# Patient Record
Sex: Male | Born: 2005 | Hispanic: Yes | Marital: Single | Smoking: Never smoker
Health system: Southern US, Community
[De-identification: ages and names within clinical notes are randomized; demographics above are authoritative.]

---
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
## 2023-01-06 ENCOUNTER — Telehealth (INDEPENDENT_AMBULATORY_CARE_PROVIDER_SITE_OTHER): Payer: Self-pay | Admitting: Primary Care

## 2023-01-06 NOTE — Telephone Encounter (Signed)
Called and pt wanted to reschedule apt for 6/13 @ 3:10p

## 2023-01-07 ENCOUNTER — Ambulatory Visit (INDEPENDENT_AMBULATORY_CARE_PROVIDER_SITE_OTHER): Payer: Self-pay | Admitting: Primary Care

## 2023-01-15 ENCOUNTER — Telehealth (INDEPENDENT_AMBULATORY_CARE_PROVIDER_SITE_OTHER): Payer: Self-pay | Admitting: Primary Care

## 2023-01-15 NOTE — Telephone Encounter (Signed)
Spoke with pt. Will be at apt on 6/13

## 2023-01-16 ENCOUNTER — Ambulatory Visit (INDEPENDENT_AMBULATORY_CARE_PROVIDER_SITE_OTHER): Payer: Medicaid Other | Admitting: Primary Care

## 2023-01-16 ENCOUNTER — Encounter (INDEPENDENT_AMBULATORY_CARE_PROVIDER_SITE_OTHER): Payer: Self-pay | Admitting: Primary Care

## 2023-01-16 VITALS — BP 121/71 | HR 72 | Resp 16 | Ht 68.0 in | Wt 229.8 lb

## 2023-01-16 DIAGNOSIS — Z68.41 Body mass index (BMI) pediatric, greater than or equal to 95th percentile for age: Secondary | ICD-10-CM | POA: Diagnosis not present

## 2023-01-16 DIAGNOSIS — Z7689 Persons encountering health services in other specified circumstances: Secondary | ICD-10-CM | POA: Diagnosis not present

## 2023-01-16 DIAGNOSIS — R7303 Prediabetes: Secondary | ICD-10-CM | POA: Diagnosis not present

## 2023-01-16 DIAGNOSIS — E6609 Other obesity due to excess calories: Secondary | ICD-10-CM

## 2023-01-16 DIAGNOSIS — Z09 Encounter for follow-up examination after completed treatment for conditions other than malignant neoplasm: Secondary | ICD-10-CM | POA: Diagnosis not present

## 2023-01-16 NOTE — Patient Instructions (Addendum)
Prediabetes Prediabetes is when your blood sugar (blood glucose) level is higher than normal but not high enough for you to be diagnosed with type 2 diabetes. Having prediabetes puts you at risk for developing type 2 diabetes (type 2 diabetes mellitus). With certain lifestyle changes, you may be able to prevent or delay the onset of type 2 diabetes. This is important because type 2 diabetes can lead to serious complications, such as: Heart disease. Stroke. Blindness. Kidney disease. Depression. Poor circulation in the feet and legs. In severe cases, this could lead to surgical removal of a leg (amputation). What are the causes? The exact cause of prediabetes is not known. It may result from insulin resistance. Insulin resistance develops when cells in the body do not respond properly to insulin that the body makes. This can cause excess glucose to build up in the blood. High blood glucose (hyperglycemia) can develop. What increases the risk? The following factors may make you more likely to develop this condition: You have a family member with type 2 diabetes. You are older than 45 years. You had a temporary form of diabetes during a pregnancy (gestational diabetes). You had polycystic ovary syndrome (PCOS). You are overweight or obese. You are inactive (sedentary). You have a history of heart disease, including problems with cholesterol levels, high levels of blood fats, or high blood pressure. What are the signs or symptoms? You may have no symptoms. If you do have symptoms, they may include: Increased hunger. Increased thirst. Increased urination. Vision changes, such as blurry vision. Tiredness (fatigue). How is this diagnosed? This condition can be diagnosed with blood tests. Your blood glucose may be checked with one or more of the following tests: A fasting blood glucose (FBG) test. You will not be allowed to eat (you will fast) for at least 8 hours before a blood sample is  taken. An A1C blood test (hemoglobin A1C). This test provides information about blood glucose levels over the previous 2?3 months. An oral glucose tolerance test (OGTT). This test measures your blood glucose at two points in time: After fasting. This is your baseline level. Two hours after you drink a beverage that contains glucose. You may be diagnosed with prediabetes if: Your FBG is 100?125 mg/dL (4.0-9.8 mmol/L). Your A1C level is 5.7?6.4% (39-46 mmol/mol). Your OGTT result is 140?199 mg/dL (1.1-91 mmol/L). These blood tests may be repeated to confirm your diagnosis. How is this treated? Treatment may include dietary and lifestyle changes to help lower your blood glucose and prevent type 2 diabetes from developing. In some cases, medicine may be prescribed to help lower the risk of type 2 diabetes. Follow these instructions at home: Nutrition  Follow a healthy meal plan. This includes eating lean proteins, whole grains, legumes, fresh fruits and vegetables, low-fat dairy products, and healthy fats. Follow instructions from your health care provider about eating or drinking restrictions. Meet with a dietitian to create a healthy eating plan that is right for you. Lifestyle Do moderate-intensity exercise for at least 30 minutes a day on 5 or more days each week, or as told by your health care provider. A mix of activities may be best, such as: Brisk walking, swimming, biking, and weight lifting. Lose weight as told by your health care provider. Losing 5-7% of your body weight can reverse insulin resistance. Do not drink alcohol if: Your health care provider tells you not to drink. You are pregnant, may be pregnant, or are planning to become pregnant. If you drink alcohol:  Limit how much you use to: 0-1 drink a day for women. 0-2 drinks a day for men. Be aware of how much alcohol is in your drink. In the [place] one drink equals one 12 oz bottle of beer (355 mL), one 5 oz glass of wine  (148 mL), or one 1 oz glass of hard liquor (44 mL). General instructions Take over-the-counter and prescription medicines only as told by your health care provider. You may be prescribed medicines that help lower the risk of type 2 diabetes. Do not use any products that contain nicotine or tobacco, such as cigarettes, e-cigarettes, and chewing tobacco. If you need help quitting, ask your health care provider. Keep all follow-up visits. This is important. Where to find more information American Diabetes Association: www.diabetes.org Academy of Nutrition and Dietetics: www.eatright.org American Heart Association: www.heart.org Contact a health care provider if: You have any of these symptoms: Increased hunger. Increased urination. Increased thirst. Fatigue. Vision changes, such as blurry vision. Get help right away if you: Have shortness of breath. Feel confused. Vomit or feel like you may vomit. Summary Prediabetes is when your blood sugar (blood glucose)level is higher than normal but not high enough for you to be diagnosed with type 2 diabetes. Having prediabetes puts you at risk for developing type 2 diabetes (type 2 diabetes mellitus). Make lifestyle changes such as eating a healthy diet and exercising regularly to help prevent diabetes. Lose weight as told by your health care provider. This information is not intended to replace advice given to you by your health care provider. Make sure you discuss any questions you have with your health care provider. Document Revised: 10/21/2019 Document Reviewed: 10/21/2019 Elsevier Patient Education  2024 [employer] Calorie Counting for Dakota Koch Calories are units of energy. Your body needs a certain number of calories from food to keep going throughout the day. When you eat or drink more calories than your body needs, your body stores the extra calories mostly as fat. When you eat or drink fewer calories than your body needs, your body  burns fat to get the energy it needs. Calorie counting means keeping track of how many calories you eat and drink each day. Calorie counting can be helpful if you need to lose weight. If you eat fewer calories than your body needs, you should lose weight. Ask your health care provider what a healthy weight is for you. For calorie counting to work, you will need to eat the right number of calories each day to lose a healthy amount of weight per week. A dietitian can help you figure out how many calories you need in a day and will suggest ways to reach your calorie goal. A healthy amount of weight to lose each week is usually 1-2 lb (0.5-0.9 kg). This usually means that your daily calorie intake should be reduced by 500-750 calories. Eating 1,200-1,500 calories a day can help most women lose weight. Eating 1,500-1,800 calories a day can help most men lose weight. What do I need to know about calorie counting? Work with your health care provider or dietitian to determine how many calories you should get each day. To meet your daily calorie goal, you will need to: Find out how many calories are in each food that you would like to eat. Try to do this before you eat. Decide how much of the food you plan to eat. Keep a food log. Do this by writing down what you ate and how many calories  it had. To successfully lose weight, it is important to balance calorie counting with a healthy lifestyle that includes regular activity. Where do I find calorie information?  The number of calories in a food can be found on a Nutrition Facts label. If a food does not have a Nutrition Facts label, try to look up the calories online or ask your dietitian for help. Remember that calories are listed per serving. If you choose to have more than one serving of a food, you will have to multiply the calories per serving by the number of servings you plan to eat. For example, the label on a package of bread might say that a serving  size is 1 slice and that there are 90 calories in a serving. If you eat 1 slice, you will have eaten 90 calories. If you eat 2 slices, you will have eaten 180 calories. How do I keep a food log? After each time that you eat, record the following in your food log as soon as possible: What you ate. Be sure to include toppings, sauces, and other extras on the food. How much you ate. This can be measured in cups, ounces, or number of items. How many calories were in each food and drink. The total number of calories in the food you ate. Keep your food log near you, such as in a pocket-sized notebook or on an app or website on your mobile phone. Some programs will calculate calories for you and show you how many calories you have left to meet your daily goal. What are some portion-control tips? Know how many calories are in a serving. This will help you know how many servings you can have of a certain food. Use a measuring cup to measure serving sizes. You could also try weighing out portions on a kitchen scale. With time, you will be able to estimate serving sizes for some foods. Take time to put servings of different foods on your favorite plates or in your favorite bowls and cups so you know what a serving looks like. Try not to eat straight from a food's packaging, such as from a bag or box. Eating straight from the package makes it hard to see how much you are eating and can lead to overeating. Put the amount you would like to eat in a cup or on a plate to make sure you are eating the right portion. Use smaller plates, glasses, and bowls for smaller portions and to prevent overeating. Try not to multitask. For example, avoid watching TV or using your computer while eating. If it is time to eat, sit down at a table and enjoy your food. This will help you recognize when you are full. It will also help you be more mindful of what and how much you are eating. What are tips for following this plan? Reading  food labels Check the calorie count compared with the serving size. The serving size may be smaller than what you are used to eating. Check the source of the calories. Try to choose foods that are high in protein, fiber, and vitamins, and low in saturated fat, trans fat, and sodium. Shopping Read nutrition labels while you shop. This will help you make healthy decisions about which foods to buy. Pay attention to nutrition labels for low-fat or fat-free foods. These foods sometimes have the same number of calories or more calories than the full-fat versions. They also often have added sugar, starch, or salt to make  up for flavor that was removed with the fat. Make a grocery list of lower-calorie foods and stick to it. Cooking Try to cook your favorite foods in a healthier way. For example, try baking instead of frying. Use low-fat dairy products. Meal planning Use more fruits and vegetables. One-half of your plate should be fruits and vegetables. Include lean proteins, such as chicken, [place], and fish. Lifestyle Each week, aim to do one of the following: 150 minutes of moderate exercise, such as walking. 75 minutes of vigorous exercise, such as running. General information Know how many calories are in the foods you eat most often. This will help you calculate calorie counts faster. Find a way of tracking calories that works for you. Get creative. Try different apps or programs if writing down calories does not work for you. What foods should I eat?  Eat nutritious foods. It is better to have a nutritious, high-calorie food, such as an avocado, than a food with few nutrients, such as a bag of potato chips. Use your calories on foods and drinks that will fill you up and will not leave you hungry soon after eating. Examples of foods that fill you up are nuts and nut butters, vegetables, lean proteins, and high-fiber foods such as whole grains. High-fiber foods are foods with more than 5 g of  fiber per serving. Pay attention to calories in drinks. Low-calorie drinks include water and unsweetened drinks. The items listed above may not be a complete list of foods and beverages you can eat. Contact a dietitian for more information. What foods should I limit? Limit foods or drinks that are not good sources of vitamins, minerals, or protein or that are high in unhealthy fats. These include: Candy. Other sweets. Sodas, specialty coffee drinks, alcohol, and juice. The items listed above may not be a complete list of foods and beverages you should avoid. Contact a dietitian for more information. How do I count calories when eating out? Pay attention to portions. Often, portions are much larger when eating out. Try these tips to keep portions smaller: Consider sharing a meal instead of getting your own. If you get your own meal, eat only half of it. Before you start eating, ask for a container and put half of your meal into it. When available, consider ordering smaller portions from the menu instead of full portions. Pay attention to your food and drink choices. Knowing the way food is cooked and what is included with the meal can help you eat fewer calories. If calories are listed on the menu, choose the lower-calorie options. Choose dishes that include vegetables, fruits, whole grains, low-fat dairy products, and lean proteins. Choose items that are boiled, broiled, grilled, or steamed. Avoid items that are buttered, battered, fried, or served with cream sauce. Items labeled as crispy are usually fried, unless stated otherwise. Choose water, low-fat milk, unsweetened iced tea, or other drinks without added sugar. If you want an alcoholic beverage, choose a lower-calorie option, such as a glass of wine or light beer. Ask for dressings, sauces, and syrups on the side. These are usually high in calories, so you should limit the amount you eat. If you want a salad, choose a garden salad and ask  for grilled meats. Avoid extra toppings such as bacon, cheese, or fried items. Ask for the dressing on the side, or ask for olive oil and vinegar or lemon to use as dressing. Estimate how many servings of a food you are given. Knowing  serving sizes will help you be aware of how much food you are eating at restaurants. Where to find more information Centers for Disease Control and Prevention: FootballExhibition.com.br [place]. Department of Agriculture: WrestlingReporter.dk Summary Calorie counting means keeping track of how many calories you eat and drink each day. If you eat fewer calories than your body needs, you should lose weight. A healthy amount of weight to lose per week is usually 1-2 lb (0.5-0.9 kg). This usually means reducing your daily calorie intake by 500-750 calories. The number of calories in a food can be found on a Nutrition Facts label. If a food does not have a Nutrition Facts label, try to look up the calories online or ask your dietitian for help. Use smaller plates, glasses, and bowls for smaller portions and to prevent overeating. Use your calories on foods and drinks that will fill you up and not leave you hungry shortly after a meal. This information is not intended to replace advice given to you by your health care provider. Make sure you discuss any questions you have with your health care provider. Document Revised: 09/02/2019 Document Reviewed: 09/02/2019 Elsevier Patient Education  2023 [employer].

## 2023-01-16 NOTE — Progress Notes (Signed)
  [facility]   Subjective:  [name] is a 17 y.o. male presents for hospital follow up and establish care. Patient presented to ED from home due to intractable nausea, vomiting, and abdominal pain. Admit date to the hospital was 12/24/22, patient was discharged from the hospital on 12/26/22, patient was admitted for: SIRS (systemic inflammatory response syndrome)  and AKI (acute kidney injury) . Today he is feeling much better thinking back on this event he had not taken in a lot of fluids or foods. He also learns he is prediabetic. Reveled after reviewing labs. Patient has No headache, No chest pain, No abdominal pain - No Nausea, No new weakness tingling or numbness, No Cough - shortness of breath  Past Medical History:  Diagnosis Date   Contact dermatitis 12/23/2013     No Known Allergies    No current outpatient medications on file prior to visit.   No current facility-administered medications on file prior to visit.   Review of System: Comprehensive ROS Pertinent positive and negative noted in HPI    Objective:  Blood Pressure 121/71   Pulse 72   Respiration 16   Height 5\' 8"  (1.727 m)   Weight 229 lb 12.8 oz (104.2 kg)   Oxygen Saturation 98%   Body Mass Index 34.94 kg/m   Filed Weights   01/16/23 1528  Weight: 229 lb 12.8 oz (104.2 kg)    Physical Exam: General Appearance: Well nourished, in no apparent distress. Eyes: PERRLA, EOMs, conjunctiva no swelling or erythema Sinuses: No Frontal/maxillary tenderness ENT/Mouth: Ext aud canals clear, TMs without erythema, Hearing normal.  Neck: Supple, thyroid normal.  Respiratory: Respiratory effort normal, BS equal bilaterally without rales, rhonchi, wheezing or stridor.  Cardio: RRR with no MRGs. Brisk peripheral pulses without edema.  Abdomen: Soft, + BS.  Non tender, no guarding, rebound, hernias, masses. Lymphatics: Non tender without lymphadenopathy.  Musculoskeletal: Full ROM, 5/5  strength, normal gait.  Skin: Warm, dry without rashes, lesions, ecchymosis.  Neuro: Cranial nerves intact. Normal muscle tone, no cerebellar symptoms. Sensation intact.  Psych: Awake and oriented X 3, normal affect, Insight and Judgment appropriate.    Assessment:  Dakota Koch was seen today for hospitalization follow-up.  Diagnoses and all orders for this visit:  Prediabetes His father is a diabetic  - educated on lifestyle modifications, including but not limited to diet choices and adding exercise to daily routine. Prediabetes is 5.7-6.4 monitor carbohydrates -rice, potatoes, tortillas, breads, pasta, sweets, sodas.  Increase exercising to help maintain appropriate weight.   Hospital discharge follow-up  Obesity due to excess calories without serious comorbidity with body mass index (BMI) in 95th to 98th percentile for age in pediatric patient Discussed neither him or girlfriend cook stop eating out too much process foods , go online and find easy healthy foods to cook and exercise. 6-10 lbs weight loss agreed uon on f/up  Encounter to establish care    This note has been created with Education officer, environmental. Any transcriptional errors are unintentional.   Grayce Sessions, NP 01/16/2023, 4:10 PM

## 2023-02-03 ENCOUNTER — Other Ambulatory Visit: Payer: Self-pay

## 2023-02-03 ENCOUNTER — Inpatient Hospital Stay (HOSPITAL_COMMUNITY)
Admission: EM | Admit: 2023-02-03 | Discharge: 2023-02-05 | DRG: 683 | Disposition: A | Discharge status: Home / Self Care | Payer: Medicaid Other | Attending: Internal Medicine | Admitting: Internal Medicine

## 2023-02-03 DIAGNOSIS — R809 Proteinuria, unspecified: Secondary | ICD-10-CM | POA: Diagnosis present

## 2023-02-03 DIAGNOSIS — M6282 Rhabdomyolysis: Secondary | ICD-10-CM | POA: Diagnosis present

## 2023-02-03 DIAGNOSIS — E860 Dehydration: Secondary | ICD-10-CM | POA: Diagnosis present

## 2023-02-03 DIAGNOSIS — R739 Hyperglycemia, unspecified: Secondary | ICD-10-CM | POA: Diagnosis present

## 2023-02-03 DIAGNOSIS — K2970 Gastritis, unspecified, without bleeding: Secondary | ICD-10-CM | POA: Diagnosis present

## 2023-02-03 DIAGNOSIS — E872 Acidosis, unspecified: Secondary | ICD-10-CM | POA: Diagnosis present

## 2023-02-03 DIAGNOSIS — R7303 Prediabetes: Secondary | ICD-10-CM | POA: Diagnosis present

## 2023-02-03 DIAGNOSIS — N181 Chronic kidney disease, stage 1: Secondary | ICD-10-CM | POA: Diagnosis present

## 2023-02-03 DIAGNOSIS — E669 Obesity, unspecified: Secondary | ICD-10-CM | POA: Diagnosis present

## 2023-02-03 DIAGNOSIS — E871 Hypo-osmolality and hyponatremia: Secondary | ICD-10-CM | POA: Diagnosis present

## 2023-02-03 DIAGNOSIS — N179 Acute kidney failure, unspecified: Secondary | ICD-10-CM | POA: Diagnosis not present

## 2023-02-03 DIAGNOSIS — Z6835 Body mass index (BMI) 35.0-35.9, adult: Secondary | ICD-10-CM

## 2023-02-03 LAB — COMPREHENSIVE METABOLIC PANEL
ALT: 35 U/L (ref 0–44)
AST: 32 U/L (ref 15–41)
Albumin: 6 g/dL — ABNORMAL HIGH (ref 3.5–5.0)
Alkaline Phosphatase: 93 U/L (ref 38–126)
Anion gap: 21 — ABNORMAL HIGH (ref 5–15)
BUN: 38 mg/dL — ABNORMAL HIGH (ref 6–20)
CO2: 17 mmol/L — ABNORMAL LOW (ref 22–32)
Calcium: 10.6 mg/dL — ABNORMAL HIGH (ref 8.9–10.3)
Chloride: 97 mmol/L — ABNORMAL LOW (ref 98–111)
Creatinine, Ser: 3.05 mg/dL — ABNORMAL HIGH (ref 0.61–1.24)
GFR, Estimated: 29 mL/min — ABNORMAL LOW (ref >60)
Glucose, Bld: 168 mg/dL — ABNORMAL HIGH (ref 70–99)
Potassium: 3.9 mmol/L (ref 3.5–5.1)
Sodium: 135 mmol/L (ref 135–145)
Total Bilirubin: 0.7 mg/dL (ref 0.3–1.2)
Total Protein: 9.7 g/dL — ABNORMAL HIGH (ref 6.5–8.1)

## 2023-02-03 LAB — CBC
HCT: 51.8 % (ref 39.0–52.0)
Hemoglobin: 17.5 g/dL — ABNORMAL HIGH (ref 13.0–17.0)
MCH: 28.7 pg (ref 26.0–34.0)
MCHC: 33.8 g/dL (ref 30.0–36.0)
MCV: 84.9 fL (ref 80.0–100.0)
Platelets: 252 10*3/uL (ref 150–400)
RBC: 6.1 MIL/uL — ABNORMAL HIGH (ref 4.22–5.81)
RDW: 12.8 % (ref 11.5–15.5)
WBC: 18.3 10*3/uL — ABNORMAL HIGH (ref 4.0–10.5)
nRBC: 0 % (ref 0.0–0.2)

## 2023-02-03 LAB — URINALYSIS, ROUTINE W REFLEX MICROSCOPIC
Glucose, UA: NEGATIVE mg/dL
Hgb urine dipstick: NEGATIVE
Ketones, ur: 5 mg/dL — AB
Leukocytes,Ua: NEGATIVE
Nitrite: NEGATIVE
Protein, ur: 100 mg/dL — AB
Specific Gravity, Urine: 1.021 (ref 1.005–1.030)
pH: 5 (ref 5.0–8.0)

## 2023-02-03 LAB — LIPASE, BLOOD: Lipase: 30 U/L (ref 11–51)

## 2023-02-03 NOTE — ED Triage Notes (Signed)
Pt arrives to ED c/o nausea/ vomiting x 1 day. Pt with multiple episodes fo each. Pt reports that he was seen 1 month ago for AKI with same symptoms. Pt unable to contain anything PO. Pt endorses cramping/right side pain when vomiting

## 2023-02-04 ENCOUNTER — Emergency Department (HOSPITAL_COMMUNITY): Payer: Medicaid Other

## 2023-02-04 ENCOUNTER — Encounter (HOSPITAL_COMMUNITY): Payer: Self-pay | Admitting: Internal Medicine

## 2023-02-04 DIAGNOSIS — K2970 Gastritis, unspecified, without bleeding: Secondary | ICD-10-CM | POA: Diagnosis not present

## 2023-02-04 DIAGNOSIS — Z6835 Body mass index (BMI) 35.0-35.9, adult: Secondary | ICD-10-CM | POA: Diagnosis not present

## 2023-02-04 DIAGNOSIS — R109 Unspecified abdominal pain: Secondary | ICD-10-CM | POA: Diagnosis not present

## 2023-02-04 DIAGNOSIS — E669 Obesity, unspecified: Secondary | ICD-10-CM | POA: Diagnosis not present

## 2023-02-04 DIAGNOSIS — N179 Acute kidney failure, unspecified: Secondary | ICD-10-CM

## 2023-02-04 DIAGNOSIS — E871 Hypo-osmolality and hyponatremia: Secondary | ICD-10-CM | POA: Diagnosis not present

## 2023-02-04 DIAGNOSIS — R112 Nausea with vomiting, unspecified: Secondary | ICD-10-CM | POA: Diagnosis not present

## 2023-02-04 DIAGNOSIS — E872 Acidosis, unspecified: Secondary | ICD-10-CM | POA: Diagnosis not present

## 2023-02-04 DIAGNOSIS — R7303 Prediabetes: Secondary | ICD-10-CM | POA: Diagnosis not present

## 2023-02-04 DIAGNOSIS — R739 Hyperglycemia, unspecified: Secondary | ICD-10-CM | POA: Diagnosis not present

## 2023-02-04 DIAGNOSIS — N181 Chronic kidney disease, stage 1: Secondary | ICD-10-CM | POA: Diagnosis not present

## 2023-02-04 DIAGNOSIS — R809 Proteinuria, unspecified: Secondary | ICD-10-CM | POA: Diagnosis not present

## 2023-02-04 DIAGNOSIS — E869 Volume depletion, unspecified: Secondary | ICD-10-CM | POA: Diagnosis not present

## 2023-02-04 DIAGNOSIS — M6282 Rhabdomyolysis: Secondary | ICD-10-CM | POA: Diagnosis not present

## 2023-02-04 DIAGNOSIS — E860 Dehydration: Secondary | ICD-10-CM | POA: Diagnosis not present

## 2023-02-04 DIAGNOSIS — N3289 Other specified disorders of bladder: Secondary | ICD-10-CM | POA: Diagnosis not present

## 2023-02-04 LAB — URINALYSIS, ROUTINE W REFLEX MICROSCOPIC
Bilirubin Urine: NEGATIVE
Glucose, UA: NEGATIVE mg/dL
Hgb urine dipstick: NEGATIVE
Ketones, ur: 20 mg/dL — AB
Leukocytes,Ua: NEGATIVE
Nitrite: NEGATIVE
Protein, ur: 30 mg/dL — AB
Specific Gravity, Urine: 1.025 (ref 1.005–1.030)
pH: 5 (ref 5.0–8.0)

## 2023-02-04 LAB — RENAL FUNCTION PANEL
Albumin: 6 g/dL — ABNORMAL HIGH (ref 3.5–5.0)
Anion gap: 20 — ABNORMAL HIGH (ref 5–15)
BUN: 37 mg/dL — ABNORMAL HIGH (ref 6–20)
CO2: 17 mmol/L — ABNORMAL LOW (ref 22–32)
Calcium: 10.3 mg/dL (ref 8.9–10.3)
Chloride: 95 mmol/L — ABNORMAL LOW (ref 98–111)
Creatinine, Ser: 3.04 mg/dL — ABNORMAL HIGH (ref 0.61–1.24)
GFR, Estimated: 29 mL/min — ABNORMAL LOW (ref >60)
Glucose, Bld: 159 mg/dL — ABNORMAL HIGH (ref 70–99)
Phosphorus: 5.2 mg/dL — ABNORMAL HIGH (ref 2.5–4.6)
Potassium: 4 mmol/L (ref 3.5–5.1)
Sodium: 132 mmol/L — ABNORMAL LOW (ref 135–145)

## 2023-02-04 LAB — CBC
HCT: 46.5 % (ref 39.0–52.0)
Hemoglobin: 15.9 g/dL (ref 13.0–17.0)
MCH: 28.9 pg (ref 26.0–34.0)
MCHC: 34.2 g/dL (ref 30.0–36.0)
MCV: 84.5 fL (ref 80.0–100.0)
Platelets: 220 10*3/uL (ref 150–400)
RBC: 5.5 MIL/uL (ref 4.22–5.81)
RDW: 12.9 % (ref 11.5–15.5)
WBC: 13.7 10*3/uL — ABNORMAL HIGH (ref 4.0–10.5)
nRBC: 0 % (ref 0.0–0.2)

## 2023-02-04 LAB — COMPREHENSIVE METABOLIC PANEL
ALT: 35 U/L (ref 0–44)
CO2: 14 mmol/L — ABNORMAL LOW (ref 22–32)
Calcium: 10.5 mg/dL — ABNORMAL HIGH (ref 8.9–10.3)
Creatinine, Ser: 3.09 mg/dL — ABNORMAL HIGH (ref 0.61–1.24)
GFR, Estimated: 29 mL/min — ABNORMAL LOW (ref >60)
Glucose, Bld: 162 mg/dL — ABNORMAL HIGH (ref 70–99)

## 2023-02-04 LAB — GLUCOSE, CAPILLARY
Glucose-Capillary: 116 mg/dL — ABNORMAL HIGH (ref 70–99)
Glucose-Capillary: 140 mg/dL — ABNORMAL HIGH (ref 70–99)
Glucose-Capillary: 134 mg/dL — ABNORMAL HIGH (ref 70–99)

## 2023-02-04 LAB — SODIUM, URINE, RANDOM: Sodium, Ur: 20 mmol/L

## 2023-02-04 LAB — CREATININE, SERUM
Creatinine, Ser: 2.19 mg/dL — ABNORMAL HIGH (ref 0.61–1.24)
GFR, Estimated: 44 mL/min — ABNORMAL LOW (ref >60)

## 2023-02-04 LAB — CK: Total CK: 411 U/L — ABNORMAL HIGH (ref 49–397)

## 2023-02-04 LAB — CREATININE, URINE, RANDOM: Creatinine, Urine: 263 mg/dL

## 2023-02-04 MED ORDER — PROCHLORPERAZINE EDISYLATE 10 MG/2ML IJ SOLN: 5.0000 mg | Freq: Four times a day (QID) | INTRAMUSCULAR | Status: DC | PRN

## 2023-02-04 MED ORDER — INSULIN ASPART 100 UNIT/ML IJ SOLN
0.0000 [IU] | Freq: Three times a day (TID) | INTRAMUSCULAR | Status: DC
  Administered 2023-02-04: 1 [IU] via SUBCUTANEOUS

## 2023-02-04 MED ORDER — TRAZODONE HCL 50 MG PO TABS: 50.0000 mg | ORAL_TABLET | Freq: Every evening | ORAL | Status: DC | PRN

## 2023-02-04 MED ORDER — ENOXAPARIN SODIUM 40 MG/0.4ML IJ SOSY: 40.0000 mg | PREFILLED_SYRINGE | INTRAMUSCULAR | Status: DC

## 2023-02-04 MED ORDER — SENNOSIDES-DOCUSATE SODIUM 8.6-50 MG PO TABS: 1.0000 | ORAL_TABLET | Freq: Every evening | ORAL | Status: DC | PRN

## 2023-02-04 MED ORDER — SODIUM CHLORIDE 0.9 % IV BOLUS
1000.0000 mL | Freq: Once | INTRAVENOUS | Status: AC
  Administered 2023-02-04: 1000 mL via INTRAVENOUS

## 2023-02-04 MED ORDER — HYDRALAZINE HCL 20 MG/ML IJ SOLN: 10.0000 mg | INTRAMUSCULAR | Status: DC | PRN

## 2023-02-04 MED ORDER — MELATONIN 5 MG PO TABS: 5.0000 mg | ORAL_TABLET | Freq: Every evening | ORAL | Status: DC | PRN

## 2023-02-04 MED ORDER — SODIUM CHLORIDE 0.9 % IV SOLN: INTRAVENOUS | Status: AC

## 2023-02-04 MED ORDER — METOPROLOL TARTRATE 5 MG/5ML IV SOLN: 5.0000 mg | INTRAVENOUS | Status: DC | PRN

## 2023-02-04 MED ORDER — IPRATROPIUM-ALBUTEROL 0.5-2.5 (3) MG/3ML IN SOLN: 3.0000 mL | RESPIRATORY_TRACT | Status: DC | PRN

## 2023-02-04 MED ORDER — MORPHINE SULFATE (PF) 4 MG/ML IV SOLN
4.0000 mg | Freq: Once | INTRAVENOUS | Status: AC
  Administered 2023-02-04: 4 mg via INTRAVENOUS
  Filled 2023-02-04: qty 1

## 2023-02-04 MED ORDER — ACETAMINOPHEN 325 MG PO TABS: 650.0000 mg | ORAL_TABLET | Freq: Four times a day (QID) | ORAL | Status: DC | PRN

## 2023-02-04 MED ORDER — ONDANSETRON HCL 4 MG/2ML IJ SOLN
4.0000 mg | Freq: Once | INTRAMUSCULAR | Status: AC
  Administered 2023-02-04: 4 mg via INTRAVENOUS
  Filled 2023-02-04: qty 2

## 2023-02-04 MED ORDER — SODIUM BICARBONATE 650 MG PO TABS
650.0000 mg | ORAL_TABLET | Freq: Two times a day (BID) | ORAL | Status: DC
  Administered 2023-02-04 (×2): 650 mg via ORAL
  Filled 2023-02-04 (×2): qty 1

## 2023-02-04 MED ORDER — SODIUM CHLORIDE 0.9 % IV SOLN: INTRAVENOUS | Status: DC

## 2023-02-04 MED ORDER — POLYETHYLENE GLYCOL 3350 17 G PO PACK: 17.0000 g | PACK | Freq: Every day | ORAL | Status: DC | PRN

## 2023-02-04 NOTE — ED Notes (Signed)
ED TO INPATIENT HANDOFF REPORT  ED Nurse Name and Phone #: [phone] Dakota Koch  S Name/Age/Gender Dakota Koch 17 y.o. male Room/Bed: 026C/026C  Code Status   Code Status: Prior  Home/SNF/Other Home Patient oriented to: self, place, time, and situation Is this baseline? Yes   Triage Complete: Triage complete  Chief Complaint AKI (acute kidney injury) (HCC) [N17.9]  Triage Note Pt arrives to ED c/o nausea/ vomiting x 1 day. Pt with multiple episodes fo each. Pt reports that he was seen 1 month ago for AKI with same symptoms. Pt unable to contain anything PO. Pt endorses cramping/right side pain when vomiting   Allergies No Known Allergies  Level of Care/Admitting Diagnosis ED Disposition     ED Disposition  Admit   Condition  --   Comment  Hospital Area: [facility] [[id number]]  Level of Care: Med-Surg [16]  May admit patient to [facility] or [facility] if equivalent level of care is available:: Yes  Covid Evaluation: Asymptomatic - no recent exposure (last 10 days) testing not required  Diagnosis: AKI (acute kidney injury) [facility]) [[id number]]  Admitting Physician: Darlin Drop [[id number]]  Attending Physician: Darlin Drop [[id number]]  Certification:: I certify this patient will need inpatient services for at least 2 midnights  Estimated Length of Stay: 2          B Medical/Surgery History Past Medical History:  Diagnosis Date   Contact dermatitis 12/23/2013   No past surgical history on file.   A IV Location/Drains/Wounds Patient Lines/Drains/Airways Status     Active Line/Drains/Airways     Name Placement date Placement time Site Days   Peripheral IV 02/04/23 20 G Anterior;Right Forearm 02/04/23  0045  Forearm  less than 1            Intake/Output Last 24 hours  Intake/Output Summary (Last 24 hours) at 02/04/2023 0143 Last data filed at 02/04/2023 0143 Gross per 24 hour  Intake 1000 ml  Output --  Net 1000 ml     Labs/Imaging Results for orders placed or performed during the hospital encounter of 02/03/23 (from the past 48 hour(s))  Lipase, blood     Status: None   Collection Time: 02/03/23 10:13 PM  Result Value Ref Range   Lipase 30 11 - 51 U/L    Comment: Performed at [facility], [address] [place], [place] [address]  Comprehensive metabolic panel     Status: Abnormal   Collection Time: 02/03/23 10:13 PM  Result Value Ref Range   Sodium 135 135 - 145 mmol/L   Potassium 3.9 3.5 - 5.1 mmol/L   Chloride 97 (L) 98 - 111 mmol/L   CO2 17 (L) 22 - 32 mmol/L   Glucose, Bld 168 (H) 70 - 99 mg/dL    Comment: Glucose reference range applies only to samples taken after fasting for at least 8 hours.   BUN 38 (H) 6 - 20 mg/dL   Creatinine, Ser 2.84 (H) 0.61 - 1.24 mg/dL   Calcium 13.2 (H) 8.9 - 10.3 mg/dL   Total Protein 9.7 (H) 6.5 - 8.1 g/dL   Albumin 6.0 (H) 3.5 - 5.0 g/dL   AST 32 15 - 41 U/L   ALT 35 0 - 44 U/L   Alkaline Phosphatase 93 38 - 126 U/L   Total Bilirubin 0.7 0.3 - 1.2 mg/dL   GFR, Estimated 29 (L) >60 mL/min    Comment: (NOTE) Calculated using the CKD-EPI Creatinine Equation (2021)  Anion gap 21 (H) 5 - 15    Comment: ELECTROLYTES REPEATED TO VERIFY Performed at [facility], [address], [place], [place] [address]   CBC     Status: Abnormal   Collection Time: 02/03/23 10:13 PM  Result Value Ref Range   WBC 18.3 (H) 4.0 - 10.5 K/uL   RBC 6.10 (H) 4.22 - 5.81 MIL/uL   Hemoglobin 17.5 (H) 13.0 - 17.0 g/dL   HCT 91.4 78.2 - 95.6 %   MCV 84.9 80.0 - 100.0 fL   MCH 28.7 26.0 - 34.0 pg   MCHC 33.8 30.0 - 36.0 g/dL   RDW 21.3 08.6 - 57.8 %   Platelets 252 150 - 400 K/uL   nRBC 0.0 0.0 - 0.2 %    Comment: Performed at [facility], [address], [place], [place] [address]  Urinalysis, Routine w reflex microscopic -Urine, Clean Catch     Status: Abnormal   Collection Time: 02/03/23 10:17 PM  Result Value Ref Range   Color, Urine AMBER (A)  YELLOW    Comment: BIOCHEMICALS MAY BE AFFECTED BY COLOR   APPearance CLOUDY (A) CLEAR   Specific Gravity, Urine 1.021 1.005 - 1.030   pH 5.0 5.0 - 8.0   Glucose, UA NEGATIVE NEGATIVE mg/dL   Hgb urine dipstick NEGATIVE NEGATIVE   Bilirubin Urine MODERATE (A) NEGATIVE   Ketones, ur 5 (A) NEGATIVE mg/dL   Protein, ur 952 (A) NEGATIVE mg/dL   Nitrite NEGATIVE NEGATIVE   Leukocytes,Ua NEGATIVE NEGATIVE   RBC / HPF 0-5 0 - 5 RBC/hpf   WBC, UA 0-5 0 - 5 WBC/hpf   Bacteria, UA RARE (A) NONE SEEN   Squamous Epithelial / HPF 0-5 0 - 5 /HPF   Mucus PRESENT    Hyaline Casts, UA PRESENT    Ca Oxalate Crys, UA PRESENT     Comment: Performed at [facility], [address] [place], [place] [address]   CT Renal Stone Study  Result Date: 02/04/2023 CLINICAL DATA:  Nausea and vomiting for 1 day with flank pain, initial encounter EXAM: CT ABDOMEN AND PELVIS WITHOUT CONTRAST TECHNIQUE: Multidetector CT imaging of the abdomen and pelvis was performed following the standard protocol without IV contrast. RADIATION DOSE REDUCTION: This exam was performed according to the departmental dose-optimization program which includes automated exposure control, adjustment of the mA and/or kV according to patient size and/or use of iterative reconstruction technique. COMPARISON:  12/24/2022 FINDINGS: Lower chest: No acute abnormality. Hepatobiliary: No focal liver abnormality is seen. No gallstones, gallbladder wall thickening, or biliary dilatation. Pancreas: Unremarkable. No pancreatic ductal dilatation or surrounding inflammatory changes. Spleen: Normal in size without focal abnormality. Adrenals/Urinary Tract: Adrenal glands are within normal limits. Kidneys are well visualized bilaterally. No renal calculi are seen. Bladder is partially distended. Stomach/Bowel: Appendix is within normal limits. No obstructive or inflammatory changes of the colon are noted. Small bowel and stomach are within normal limits.  Vascular/Lymphatic: No significant vascular findings are present. No enlarged abdominal or pelvic lymph nodes. Reproductive: Prostate is unremarkable. Other: No abdominal wall hernia or abnormality. No abdominopelvic ascites. Musculoskeletal: No acute or significant osseous findings. IMPRESSION: No acute abnormality noted. Electronically Signed   By: Alcide Clever M.D.   On: 02/04/2023 01:10    Pending Labs Unresulted Labs (From admission, onward)    None       Vitals/Pain Today's Vitals   02/04/23 0105 02/04/23 0115 02/04/23 0130 02/04/23 0136  BP: 130/72 139/83  136/64   Pulse: 76 73 61   Resp: 16     Temp: 97.6 F (36.4 C)     TempSrc: Oral     SpO2: 100% 100% 100%   Weight:      Height:      PainSc:    4     Isolation Precautions No active isolations  Medications Medications  sodium chloride 0.9 % bolus 1,000 mL (0 mLs Intravenous Stopped 02/04/23 0143)  ondansetron (ZOFRAN) injection 4 mg (4 mg Intravenous Given 02/04/23 0104)  morphine (PF) 4 MG/ML injection 4 mg (4 mg Intravenous Given 02/04/23 0105)    Mobility walks     Focused Assessments Renal Assessment Handoff:     R Recommendations: See Admitting Provider Note  Report given to:   Additional Notes: Patient ambulates no assist a/o x 4 vs wnl admit for AKI 20g rfa renal CT normal

## 2023-02-04 NOTE — ED Provider Notes (Addendum)
Dakota Koch Provider Note   CSN: [id number] Arrival date & time: 02/03/23  2126     History  Chief Complaint  Patient presents with   Nausea   Emesis    Dakota Koch is a 17 y.o. male.  Patient presents to the emergency department complaining of 1 day of nausea, vomiting, abdominal cramping.  Patient states he initially had symptoms yesterday afternoon with nausea and 3 episodes of emesis.  He states his symptoms subsided and he had 1 episode of emesis this morning before work.  He states he was okay while at work but upon returning home had 3 more episodes of emesis.  He endorses abdominal cramping that coincides with the symptoms.  The patient states he was admitted a month ago for an acute kidney injury and had similar symptoms at that time.  Patient also complains of right side pain/flank pain.  Patient denies fever, chest pain, shortness of breath.  Past medical history otherwise significant for AKI, SIRS.  HPI     Home Medications Prior to Admission medications   Not on File      Allergies    Patient has no known allergies.    Review of Systems   Review of Systems  Physical Exam Updated Vital Signs BP 136/64   Pulse 61   Temp 97.6 F (36.4 C) (Oral)   Resp 16   Ht 5\' 8"  (1.727 m)   Wt 104.8 kg   SpO2 100%   BMI 35.12 kg/m  Physical Exam Vitals and nursing note reviewed.  Constitutional:      General: He is not in acute distress.    Appearance: He is well-developed.  HENT:     Head: Normocephalic and atraumatic.  Eyes:     General: No scleral icterus.    Conjunctiva/sclera: Conjunctivae normal.  Cardiovascular:     Rate and Rhythm: Normal rate and regular rhythm.     Heart sounds: No murmur heard. Pulmonary:     Effort: Pulmonary effort is normal. No respiratory distress.     Breath sounds: Normal breath sounds.  Abdominal:     Palpations: Abdomen is soft.     Tenderness: There is no abdominal  tenderness. There is right CVA tenderness (Mild).  Musculoskeletal:        General: No swelling.     Cervical back: Neck supple.  Skin:    General: Skin is warm and dry.     Capillary Refill: Capillary refill takes less than 2 seconds.  Neurological:     Mental Status: He is alert.  Psychiatric:        Mood and Affect: Mood normal.     ED Results / Procedures / Treatments   Labs (all labs ordered are listed, but only abnormal results are displayed) Labs Reviewed  COMPREHENSIVE METABOLIC PANEL - Abnormal; Notable for the following components:      Result Value   Chloride 97 (*)    CO2 17 (*)    Glucose, Bld 168 (*)    BUN 38 (*)    Creatinine, Ser 3.05 (*)    Calcium 10.6 (*)    Total Protein 9.7 (*)    Albumin 6.0 (*)    GFR, Estimated 29 (*)    Anion gap 21 (*)    All other components within normal limits  CBC - Abnormal; Notable for the following components:   WBC 18.3 (*)    RBC 6.10 (*)    Hemoglobin  17.5 (*)    All other components within normal limits  URINALYSIS, ROUTINE W REFLEX MICROSCOPIC - Abnormal; Notable for the following components:   Color, Urine AMBER (*)    APPearance CLOUDY (*)    Bilirubin Urine MODERATE (*)    Ketones, ur 5 (*)    Protein, ur 100 (*)    Bacteria, UA RARE (*)    All other components within normal limits  LIPASE, BLOOD    EKG None  Radiology CT Renal Stone Study  Result Date: 02/04/2023 CLINICAL DATA:  Nausea and vomiting for 1 day with flank pain, initial encounter EXAM: CT ABDOMEN AND PELVIS WITHOUT CONTRAST TECHNIQUE: Multidetector CT imaging of the abdomen and pelvis was performed following the standard protocol without IV contrast. RADIATION DOSE REDUCTION: This exam was performed according to the departmental dose-optimization program which includes automated exposure control, adjustment of the mA and/or kV according to patient size and/or use of iterative reconstruction technique. COMPARISON:  12/24/2022 FINDINGS: Lower  chest: No acute abnormality. Hepatobiliary: No focal liver abnormality is seen. No gallstones, gallbladder wall thickening, or biliary dilatation. Pancreas: Unremarkable. No pancreatic ductal dilatation or surrounding inflammatory changes. Spleen: Normal in size without focal abnormality. Adrenals/Urinary Tract: Adrenal glands are within normal limits. Kidneys are well visualized bilaterally. No renal calculi are seen. Bladder is partially distended. Stomach/Bowel: Appendix is within normal limits. No obstructive or inflammatory changes of the colon are noted. Small bowel and stomach are within normal limits. Vascular/Lymphatic: No significant vascular findings are present. No enlarged abdominal or pelvic lymph nodes. Reproductive: Prostate is unremarkable. Other: No abdominal wall hernia or abnormality. No abdominopelvic ascites. Musculoskeletal: No acute or significant osseous findings. IMPRESSION: No acute abnormality noted. Electronically Signed   By: Alcide Clever M.D.   On: 02/04/2023 01:10    Procedures Procedures    Medications Ordered in ED Medications  sodium chloride 0.9 % bolus 1,000 mL (1,000 mLs Intravenous New Bag/Given 02/04/23 0103)  ondansetron (ZOFRAN) injection 4 mg (4 mg Intravenous Given 02/04/23 0104)  morphine (PF) 4 MG/ML injection 4 mg (4 mg Intravenous Given 02/04/23 0105)    ED Course/ Medical Decision Making/ A&P                             Medical Decision Making Amount and/or Complexity of Data Reviewed Radiology: ordered.  Risk Prescription drug management. Decision regarding hospitalization.   This patient presents to the ED for concern of nausea and vomiting, this involves an extensive number of treatment options, and is a complaint that carries with it a high risk of complications and morbidity.  The differential diagnosis includes gastroenteritis, appendicitis, cholecystitis, pyelonephritis, AKI, others   Co morbidities that complicate the patient  evaluation  History of recent AKI   Additional history obtained:  Additional history obtained from visitor bedside External records from outside source obtained and reviewed including discharge summary from May   Lab Tests:  I Ordered, and personally interpreted labs.  The pertinent results include: Creatinine 3.05, BUN 38, WBC 18.3, UA with moderate bilirubin, ketones, protein; normal lipase   Imaging Studies ordered:  I ordered imaging studies including CT renal stone study I independently visualized and interpreted imaging which showed no acute findings I agree with the radiologist interpretation    Consultations Obtained:  I requested consultation with the hospitalist, Dr.Hall, and discussed lab and imaging findings as well as pertinent plan - they recommend: admission   Problem List / ED  Course / Critical interventions / Medication management   I ordered medication including morphine for pain, Zofran for nausea Reevaluation of the patient after these medicines showed that the patient improved I have reviewed the patients home medicines and have made adjustments as needed   Social Determinants of Health:  Patient has Medicaid for his primary insurance type   Test / Admission - Considered:  Patient with significant AKI on laboratory workup today.  Unclear as to the etiology of his AKI.  He denies NSAID use, states that he drinks water frequently, denies any chronic medication use (no nephrotoxic's identified).  Patient needs admission for continued IV hydration, serial labs, further evaluation and management as needed.         Final Clinical Impression(s) / ED Diagnoses Final diagnoses:  AKI (acute kidney injury) [facility])    Rx / DC Orders ED Discharge Orders     None         Pamala Duffel 02/04/23 0141    Darrick Grinder, PA-C 02/04/23 0150    Nira Conn, MD 02/04/23 0800

## 2023-02-04 NOTE — H&P (Signed)
History and Physical  Dakota Koch NFA:[id number] DOB: January 10, 2006 DOA: 02/03/2023  Referring physician: Barrie Dunker, PA-EDP  PCP: Pcp, No  Outpatient Specialists: None Patient coming from: Home  Chief Complaint: Nausea and vomiting   HPI: Dakota Koch is a 17 y.o. male obese, with no significant past medical history who presents with complaints of sudden onset nausea and vomiting multiple times.  Initially started yesterday afternoon.  It recurred today after waking up this morning.  Associated with diffuse abdominal pain.  The patient is not on any prescribed medications.  He does not take any medications over-the-counter.  States he usually drinks water all day.  Works in a [employer], quantity up heavy loads regularly at work.  Today, prior to his evaluation in the ED, he had cramps involving his proximal muscles, thighs and biceps.    States he has been intentional about losing weight and has not been eating much lately, however he stays hydrated, up until this morning when he started vomiting again.  No reported subjective fevers or chills.  No family history of chronic kidney disease or family members on dialysis that he is aware of.  He presented to the ED for further evaluation.  In the ED, the patient's nausea improved with IV antiemetics and IV fluid.  His UA was negative for pyuria, showed calcium oxalate crystal, hyaline cast.  Lab studies were notable for creatinine of 3.04 from normal baseline of 0.8.  The patient was started on IV fluid hydration.  CT renal stone study was unremarkable.  CPK was mildly elevated at 411.    EDP requested admission for further management.  Admitted by [facility], hospitalist service, to MedSurg unit as inpatient status.  Nephrology consulted to assist with the management.  ED Course: Tmax 97.6.  BP 153/82, pulse 72, respiration rate 16, saturation 98% on room air.  Review of Systems: Review of systems as noted in the HPI. All other systems  reviewed and are negative.   Past Medical History:  Diagnosis Date   Contact dermatitis 12/23/2013   No past surgical history on file.  Social History:  reports that he has never smoked. He has never used smokeless tobacco. He reports current drug use. Drug: Marijuana. He reports that he does not drink alcohol.   No Known Allergies  Family history: None reported.  Home medications: None.   Physical Exam: BP (!) 153/82 (BP Location: Left Arm)   Pulse 72   Temp 97.6 F (36.4 C) (Oral)   Resp 16   Ht 5\' 8"  (1.727 m)   Wt 104.8 kg   SpO2 98%   BMI 35.12 kg/m   General: 17 y.o. year-old male well developed well nourished in no acute distress.  Alert and oriented x3. Cardiovascular: Regular rate and rhythm with no rubs or gallops.  No thyromegaly or JVD noted.  No lower extremity edema. 2/4 pulses in all 4 extremities. Respiratory: Clear to auscultation with no wheezes or rales. Good inspiratory effort. Abdomen: Soft nontender nondistended with normal bowel sounds x4 quadrants. Muskuloskeletal: No cyanosis, clubbing or edema noted bilaterally Neuro: CN II-XII intact, strength, sensation, reflexes Skin: No ulcerative lesions noted or rashes Psychiatry: Judgement and insight appear normal. Mood is appropriate for condition and setting          Labs on Admission:  Basic Metabolic Panel: Recent Labs  Lab 02/03/23 2213 02/04/23 0212  NA 132*  135  --   K 4.0  3.9  --   CL 95*  97*  --  CO2 17*  17*  --   GLUCOSE 159*  168*  --   BUN 37*  38*  --   CREATININE 3.04*  3.05* 2.19*  CALCIUM 10.3  10.6*  --   PHOS 5.2*  --    Liver Function Tests: Recent Labs  Lab 02/03/23 2213  AST 32  ALT 35  ALKPHOS 93  BILITOT 0.7  PROT 9.7*  ALBUMIN 6.0*  6.0*   Recent Labs  Lab 02/03/23 2213  LIPASE 30   No results for input(s): "AMMONIA" in the last 168 hours. CBC: Recent Labs  Lab 02/03/23 2213 02/04/23 0212  WBC 18.3* 13.7*  HGB 17.5* 15.9  HCT 51.8  46.5  MCV 84.9 84.5  PLT 252 220   Cardiac Enzymes: Recent Labs  Lab 02/04/23 0212  CKTOTAL 411*    BNP (last 3 results) No results for input(s): "BNP" in the last 8760 hours.  ProBNP (last 3 results) No results for input(s): "PROBNP" in the last 8760 hours.  CBG: No results for input(s): "GLUCAP" in the last 168 hours.  Radiological Exams on Admission: CT Renal Stone Study  Result Date: 02/04/2023 CLINICAL DATA:  Nausea and vomiting for 1 day with flank pain, initial encounter EXAM: CT ABDOMEN AND PELVIS WITHOUT CONTRAST TECHNIQUE: Multidetector CT imaging of the abdomen and pelvis was performed following the standard protocol without IV contrast. RADIATION DOSE REDUCTION: This exam was performed according to the departmental dose-optimization program which includes automated exposure control, adjustment of the mA and/or kV according to patient size and/or use of iterative reconstruction technique. COMPARISON:  12/24/2022 FINDINGS: Lower chest: No acute abnormality. Hepatobiliary: No focal liver abnormality is seen. No gallstones, gallbladder wall thickening, or biliary dilatation. Pancreas: Unremarkable. No pancreatic ductal dilatation or surrounding inflammatory changes. Spleen: Normal in size without focal abnormality. Adrenals/Urinary Tract: Adrenal glands are within normal limits. Kidneys are well visualized bilaterally. No renal calculi are seen. Bladder is partially distended. Stomach/Bowel: Appendix is within normal limits. No obstructive or inflammatory changes of the colon are noted. Small bowel and stomach are within normal limits. Vascular/Lymphatic: No significant vascular findings are present. No enlarged abdominal or pelvic lymph nodes. Reproductive: Prostate is unremarkable. Other: No abdominal wall hernia or abnormality. No abdominopelvic ascites. Musculoskeletal: No acute or significant osseous findings. IMPRESSION: No acute abnormality noted. Electronically Signed   By: Alcide Clever M.D.   On: 02/04/2023 01:10    EKG: I independently viewed the EKG done and my findings are as followed: None available at the time of this visit.  Assessment/Plan Present on Admission:  AKI (acute kidney injury) (HCC)  Principal Problem:   AKI (acute kidney injury) (HCC)  AKI, unclear etiology After vomiting x 1 day presented to the ED Creatinine noted to be elevated 3.05 from normal baseline States he has been drinking a lot of water up until this event as he is trying to lose weight. Denies any use of over-the-counter medications or supplements or NSAIDs. UA positive for calcium oxalate crystals and hyaline casts CT renal stone was unremarkable. Repeat renal function and trend creatinine/GFR Avoid nephrotoxic agents, dehydration and hypotension. Continue IV fluid hydration NS at 125 cc/h x 1 day  High anion gap metabolic acidosis Presented with serum bicarb of 17 and anion gap of 21 Blood sugar 168, prediabetic. Continue IV fluid hydration and repeat chemistry panel  Hypercalcemia likely secondary to dehydration after vomiting Presented with serum calcium of 10.6 Repeat chemistry panel after IV fluid hydration  Mild euvolemic hyponatremia  Serum sodium 132 Continue IV fluid hydration NS  Prediabetes with hyperglycemia Last hemoglobin A1c is 5.8 on 12/25/2022 Serum glucose on presentation 168 Continue to monitor, diet controlled.  Obesity BMI 35 Ongoing intentional weight loss, reported by the patient Recommend weight loss outpatient through regular physical activity and healthy dieting.  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   DVT prophylaxis: Subcu Lovenox daily  Code Status: Full code  Family Communication: Girlfriend at bedside  Disposition Plan: Admitted to MedSurg unit  Consults called: Nephrology consulted  Admission status:  Inpatient status.   Status is: Inpatient The patient requires at least 2 midnights for further evaluation and treatment of present condition.   Darlin Drop MD [facility] Pager [phone]  If 7PM-7AM, please contact night-coverage [email or web address] Password [email or web address]  02/04/2023, 6:10 AM

## 2023-02-04 NOTE — ED Notes (Signed)
Patient ambulatory to room with steady gait c/o n/v that began today with several episodes per girlfriend at bedside. Patient reports he had same symptoms a month ago.Patient reports todays symptoms began after not drinking enough water today at work. Patient states pain to RLQ wraps around into right kidney area. Patient a/o x 4 respirations even and non labored vs wnl

## 2023-02-04 NOTE — TOC CM/SW Note (Signed)
Transition of Care [facility]) - Inpatient Brief Assessment   Patient Details  Name: Keante Trojanowski MRN: [id number] Date of Birth: April 28, 2006  Transition of Care [facility]) CM/SW Contact:    Tom-Johnson, Hershal Coria, RN Phone Number: 02/04/2023, 5:43 PM   Clinical Narrative:  Patient presented to the ED with Nausea,Vomiting and Abdominal cramping. States he felt dehydrated. Nephrology following.   Lives at home with both parents. Employed and independent with his cares. Has a PCP but could not remember the name. Uses [facility] on [address]   No TOC needs or recommendations noted at this time. CM will continue to follow as patient progresses with care towards discharge.        Transition of Care Asessment: Insurance and Status: Insurance coverage has been reviewed Patient has primary care physician: Yes (Could not remember name of PCP) Home environment has been reviewed: Yes Prior level of function:: Independent Prior/Current Home Services: No current home services Social Determinants of Health Reivew: SDOH reviewed no interventions necessary Readmission risk has been reviewed: Yes Transition of care needs: no transition of care needs at this time

## 2023-02-04 NOTE — Progress Notes (Signed)
PROGRESS NOTE    Dakota Koch  WUJ:[id number] DOB: 05-25-2006 DOA: 02/03/2023 PCP: Pcp, No   Brief Narrative:  17 year old without any known past medical history comes to the hospital with sudden onset of nausea and vomiting.  Upon admission she was noted to have acute kidney injury with creatinine 3.04, CT renal study unremarkable, mild rhabdomyolysis.  She was admitted to the hospital for aggressive IV fluids.   Assessment & Plan:  Principal Problem:   AKI (acute kidney injury) (HCC)     AKI, suspect prerenal in nature Mild metabolic acidosis Admission creatinine 3.05, baseline 0.8.  CT renal study is unremarkable.  UA unremarkable.  Continue aggressive IV fluids and monitor renal function and urine output. Nephro following.    Hypercalcemia likely secondary to dehydration after vomiting Second due to dehydration   Mild euvolemic hyponatremia Should improve with IV fluids   Prediabetes with hyperglycemia Last hemoglobin A1c is 5.8 on 12/25/2022 Closely monitor   Obesity BMI 35 Ongoing intentional weight loss, reported by the patient Recommend weight loss outpatient through regular physical activity and healthy dieting.   Rhabdomyolysis CK levels 411, IV fluids.  Repeat again tomorrow   DVT prophylaxis: Lovenox Code Status: Full code Family Communication:   Status is: Inpatient Ongoing management for AKI with IV fluids       Diet Orders (From admission, onward)     Start     Ordered   02/04/23 0209  Diet regular Fluid consistency: Thin  Diet effective now       Question:  Fluid consistency:  Answer:  Thin   02/04/23 0208            Subjective: Doing ok, feels better.    Examination:  General exam: Appears calm and comfortable  Respiratory system: Clear to auscultation. Respiratory effort normal. Cardiovascular system: S1 & S2 heard, RRR. No JVD, murmurs, rubs, gallops or clicks. No pedal edema. Gastrointestinal system: Abdomen is  nondistended, soft and nontender. No organomegaly or masses felt. Normal bowel sounds heard. Central nervous system: Alert and oriented. No focal neurological deficits. Extremities: Symmetric 5 x 5 power. Skin: No rashes, lesions or ulcers Psychiatry: Judgement and insight appear normal. Mood & affect appropriate.  Objective: Vitals:   02/04/23 0145 02/04/23 0241 02/04/23 0640 02/04/23 0722  BP: (!) 141/85 (!) 153/82 (!) 141/96 136/76  Pulse: 75 72 67 72  Resp:  16 18 17   Temp:  97.6 F (36.4 C) 97.8 F (36.6 C) 98.3 F (36.8 C)  TempSrc:  Oral Oral Oral  SpO2: 100% 98% 100% 100%  Weight:   97.5 kg   Height:        Intake/Output Summary (Last 24 hours) at 02/04/2023 0836 Last data filed at 02/04/2023 0143 Gross per 24 hour  Intake 1000 ml  Output --  Net 1000 ml   Filed Weights   02/03/23 2155 02/04/23 0640  Weight: 104.8 kg 97.5 kg    Scheduled Meds:  enoxaparin (LOVENOX) injection  40 mg Subcutaneous Q24H   Continuous Infusions:  sodium chloride 125 mL/hr at 02/04/23 0725    Nutritional status     Body mass index is 32.68 kg/m.  Data Reviewed:   CBC: Recent Labs  Lab 02/03/23 2213 02/04/23 0212  WBC 18.3* 13.7*  HGB 17.5* 15.9  HCT 51.8 46.5  MCV 84.9 84.5  PLT 252 220   Basic Metabolic Panel: Recent Labs  Lab 02/03/23 2213 02/04/23 0212  NA 132*  135  --   K 4.0  3.9  --   CL 95*  97*  --   CO2 17*  17*  --   GLUCOSE 159*  168*  --   BUN 37*  38*  --   CREATININE 3.04*  3.05* 2.19*  CALCIUM 10.3  10.6*  --   PHOS 5.2*  --    GFR: Estimated Creatinine Clearance: 61.9 mL/min (A) (by C-G formula based on SCr of 2.19 mg/dL (H)). Liver Function Tests: Recent Labs  Lab 02/03/23 2213  AST 32  ALT 35  ALKPHOS 93  BILITOT 0.7  PROT 9.7*  ALBUMIN 6.0*  6.0*   Recent Labs  Lab 02/03/23 2213  LIPASE 30   No results for input(s): "AMMONIA" in the last 168 hours. Coagulation Profile: No results for input(s): "INR", "PROTIME" in  the last 168 hours. Cardiac Enzymes: Recent Labs  Lab 02/04/23 0212  CKTOTAL 411*   BNP (last 3 results) No results for input(s): "PROBNP" in the last 8760 hours. HbA1C: No results for input(s): "HGBA1C" in the last 72 hours. CBG: No results for input(s): "GLUCAP" in the last 168 hours. Lipid Profile: No results for input(s): "CHOL", "HDL", "LDLCALC", "TRIG", "CHOLHDL", "LDLDIRECT" in the last 72 hours. Thyroid Function Tests: No results for input(s): "TSH", "T4TOTAL", "FREET4", "T3FREE", "THYROIDAB" in the last 72 hours. Anemia Panel: No results for input(s): "VITAMINB12", "FOLATE", "FERRITIN", "TIBC", "IRON", "RETICCTPCT" in the last 72 hours. Sepsis Labs: No results for input(s): "PROCALCITON", "LATICACIDVEN" in the last 168 hours.  No results found for this or any previous visit (from the past 240 hour(s)).       Radiology Studies: CT Renal Stone Study  Result Date: 02/04/2023 CLINICAL DATA:  Nausea and vomiting for 1 day with flank pain, initial encounter EXAM: CT ABDOMEN AND PELVIS WITHOUT CONTRAST TECHNIQUE: Multidetector CT imaging of the abdomen and pelvis was performed following the standard protocol without IV contrast. RADIATION DOSE REDUCTION: This exam was performed according to the departmental dose-optimization program which includes automated exposure control, adjustment of the mA and/or kV according to patient size and/or use of iterative reconstruction technique. COMPARISON:  12/24/2022 FINDINGS: Lower chest: No acute abnormality. Hepatobiliary: No focal liver abnormality is seen. No gallstones, gallbladder wall thickening, or biliary dilatation. Pancreas: Unremarkable. No pancreatic ductal dilatation or surrounding inflammatory changes. Spleen: Normal in size without focal abnormality. Adrenals/Urinary Tract: Adrenal glands are within normal limits. Kidneys are well visualized bilaterally. No renal calculi are seen. Bladder is partially distended. Stomach/Bowel:  Appendix is within normal limits. No obstructive or inflammatory changes of the colon are noted. Small bowel and stomach are within normal limits. Vascular/Lymphatic: No significant vascular findings are present. No enlarged abdominal or pelvic lymph nodes. Reproductive: Prostate is unremarkable. Other: No abdominal wall hernia or abnormality. No abdominopelvic ascites. Musculoskeletal: No acute or significant osseous findings. IMPRESSION: No acute abnormality noted. Electronically Signed   By: Alcide Clever M.D.   On: 02/04/2023 01:10           LOS: 0 days   Time spent= 35 mins    Stuart Mirabile Joline Maxcy, MD [facility]  If 7PM-7AM, please contact night-coverage  02/04/2023, 8:36 AM

## 2023-02-04 NOTE — Progress Notes (Signed)
Patient admitted to the unit from the ED in a wheelchair. Patient is alert and oriented x 4.

## 2023-02-04 NOTE — Consult Note (Signed)
Reason for Consult: AKI Referring Physician: Nelson Chimes, MD  Dakota Koch is an 17 y.o. male with PMH significant for obesity, pre-diabetes, and recent admission for AKI due to volume depletion who presented to [facility] ED on 02/03/23 with 1 day history of N/V and abdominal cramping.  In the ED, he was afebrile and VSS.  Labs were notable for Cl 97, Co2 17, BUN 38, Cr 3.05, Ca 10.6, alb 6, WBC 18.3, Hgb 17.5, UA moderate bilirubin, 100 protein.  CT stone protocol was without acute abnormality.  We were consulted to further evaluate and manage his AKI.  The trend in Scr is seen below.   He had the same presentation last month.  He denies any NSAIDs, illicit drugs, diarrhea, dysuria, pyuria, hematuria, urgency, frequency, or retention.  No family history of kidney disease.  No rashes, lower extremity edema.  Trend in Creatinine: Creatinine, Ser  Date/Time Value Ref Range Status  02/04/2023 02:12 AM 2.19 (H) 0.61 - 1.24 mg/dL Final  40/98/1191 47:82 PM 3.05 (H) 0.61 - 1.24 mg/dL Final  95/62/1308 65:78 PM 3.04 (H) 0.61 - 1.24 mg/dL Final  46/96/2952 84:13 AM 0.86 0.61 - 1.24 mg/dL Final  24/40/1027 25:36 AM 1.79 (H) 0.61 - 1.24 mg/dL Final  64/40/3474 25:95 PM 3.26 (H) 0.61 - 1.24 mg/dL Final    PMH:   Past Medical History:  Diagnosis Date   Contact dermatitis 12/23/2013    PSH:  No past surgical history on file.  Allergies: No Known Allergies  Medications:   Prior to Admission medications   Not on File    Inpatient medications:  enoxaparin (LOVENOX) injection  40 mg Subcutaneous Q24H    Discontinued Meds:   Medications Discontinued During This Encounter  Medication Reason   0.9 %  sodium chloride infusion     Social History:  reports that he has never smoked. He has never used smokeless tobacco. He reports current drug use. Drug: Marijuana. He reports that he does not drink alcohol.  Family History:  No family history on file.  Pertinent items are noted in HPI. Weight change:    Intake/Output Summary (Last 24 hours) at 02/04/2023 0942 Last data filed at 02/04/2023 0143 Gross per 24 hour  Intake 1000 ml  Output --  Net 1000 ml   BP 136/76 (BP Location: Left Arm)   Pulse 72   Temp 98.3 F (36.8 C) (Oral)   Resp 17   Ht 5\' 8"  (1.727 m)   Wt 97.5 kg   SpO2 100%   BMI 32.68 kg/m  Vitals:   02/04/23 0145 02/04/23 0241 02/04/23 0640 02/04/23 0722  BP: (!) 141/85 (!) 153/82 (!) 141/96 136/76  Pulse: 75 72 67 72  Resp:  16 18 17   Temp:  97.6 F (36.4 C) 97.8 F (36.6 C) 98.3 F (36.8 C)  TempSrc:  Oral Oral Oral  SpO2: 100% 98% 100% 100%  Weight:   97.5 kg   Height:         General appearance: alert, cooperative, and no distress Head: Normocephalic, without obvious abnormality, atraumatic Eyes: negative findings: lids and lashes normal, conjunctivae and sclerae normal, and corneas clear Resp: clear to auscultation bilaterally Cardio: regular rate and rhythm, S1, S2 normal, no murmur, click, rub or gallop GI: soft, non-tender; bowel sounds normal; no masses,  no organomegaly Extremities: extremities normal, atraumatic, no cyanosis or edema  Labs: Basic Metabolic Panel: Recent Labs  Lab 02/03/23 2213 02/04/23 0212  NA 132*  135  --   K  4.0  3.9  --   CL 95*  97*  --   CO2 17*  17*  --   GLUCOSE 159*  168*  --   BUN 37*  38*  --   CREATININE 3.04*  3.05* 2.19*  ALBUMIN 6.0*  6.0*  --   CALCIUM 10.3  10.6*  --   PHOS 5.2*  --    Liver Function Tests: Recent Labs  Lab 02/03/23 2213  AST 32  ALT 35  ALKPHOS 93  BILITOT 0.7  PROT 9.7*  ALBUMIN 6.0*  6.0*   Recent Labs  Lab 02/03/23 2213  LIPASE 30   No results for input(s): "AMMONIA" in the last 168 hours. CBC: Recent Labs  Lab 02/03/23 2213 02/04/23 0212  WBC 18.3* 13.7*  HGB 17.5* 15.9  HCT 51.8 46.5  MCV 84.9 84.5  PLT 252 220   PT/INR: @LABRCNTIP (inr:5) Cardiac Enzymes: ) Recent Labs  Lab 02/04/23 0212  CKTOTAL 411*   CBG: No results for input(s):  "GLUCAP" in the last 168 hours.  Iron Studies: No results for input(s): "IRON", "TIBC", "TRANSFERRIN", "FERRITIN" in the last 168 hours.  Xrays/Other Studies: CT Renal Stone Study  Result Date: 02/04/2023 CLINICAL DATA:  Nausea and vomiting for 1 day with flank pain, initial encounter EXAM: CT ABDOMEN AND PELVIS WITHOUT CONTRAST TECHNIQUE: Multidetector CT imaging of the abdomen and pelvis was performed following the standard protocol without IV contrast. RADIATION DOSE REDUCTION: This exam was performed according to the departmental dose-optimization program which includes automated exposure control, adjustment of the mA and/or kV according to patient size and/or use of iterative reconstruction technique. COMPARISON:  12/24/2022 FINDINGS: Lower chest: No acute abnormality. Hepatobiliary: No focal liver abnormality is seen. No gallstones, gallbladder wall thickening, or biliary dilatation. Pancreas: Unremarkable. No pancreatic ductal dilatation or surrounding inflammatory changes. Spleen: Normal in size without focal abnormality. Adrenals/Urinary Tract: Adrenal glands are within normal limits. Kidneys are well visualized bilaterally. No renal calculi are seen. Bladder is partially distended. Stomach/Bowel: Appendix is within normal limits. No obstructive or inflammatory changes of the colon are noted. Small bowel and stomach are within normal limits. Vascular/Lymphatic: No significant vascular findings are present. No enlarged abdominal or pelvic lymph nodes. Reproductive: Prostate is unremarkable. Other: No abdominal wall hernia or abnormality. No abdominopelvic ascites. Musculoskeletal: No acute or significant osseous findings. IMPRESSION: No acute abnormality noted. Electronically Signed   By: Alcide Clever M.D.   On: 02/04/2023 01:10     Assessment/Plan:  AKI - recurrent in setting of volume depletion.  Scr returned to normal at last hospitalization.  Unclear why he is so sensitive to hypovolemia.  Will  order serologies for completeness.  CT scan stone protocol was unremarkable.   BUN/Cr already improving with IVF's.  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  No indication for dialysis at this time and hopefully will return to normal with IVF's.   Avoid nephrotoxic medications including NSAIDs and iodinated intravenous contrast exposure unless the latter is absolutely indicated.  Preferred narcotic agents for pain control are hydromorphone, fentanyl, and methadone. Morphine should not be used. Avoid Baclofen and avoid oral sodium phosphate and magnesium citrate based laxatives / bowel preps. Continue strict Input and Output monitoring. Will monitor the patient closely with you and intervene or adjust therapy as indicated by changes in clinical status/labs  Recurrent N/V - felt to be due to gastritis last month.  Consider GI consult since this has recurred within  a month. Proteinuria - likely due to volume depletion.  Will repeat today and workup as above. Hyperglycemia/pre-diabetes - had Hgb A1c of 5.8% in May 2024.  Likely has diagnosis of type 2 DM given glucose of 168.  Per primary.   Julien Nordmann Emilya Justen 02/04/2023, 9:42 AM

## 2023-02-04 NOTE — ED Notes (Signed)
Report called to floor

## 2023-02-05 LAB — COMPREHENSIVE METABOLIC PANEL
AST: 33 U/L (ref 15–41)
Albumin: 6 g/dL — ABNORMAL HIGH (ref 3.5–5.0)
Alkaline Phosphatase: 91 U/L (ref 38–126)
Anion gap: 22 — ABNORMAL HIGH (ref 5–15)
BUN: 36 mg/dL — ABNORMAL HIGH (ref 6–20)
Chloride: 96 mmol/L — ABNORMAL LOW (ref 98–111)
Potassium: 4 mmol/L (ref 3.5–5.1)
Sodium: 132 mmol/L — ABNORMAL LOW (ref 135–145)
Total Bilirubin: 0.8 mg/dL (ref 0.3–1.2)
Total Protein: 10.1 g/dL — ABNORMAL HIGH (ref 6.5–8.1)

## 2023-02-05 LAB — MAGNESIUM: Magnesium: 2.2 mg/dL (ref 1.7–2.4)

## 2023-02-05 LAB — CBC
HCT: 38.9 % — ABNORMAL LOW (ref 39.0–52.0)
Hemoglobin: 13.3 g/dL (ref 13.0–17.0)
MCH: 29.2 pg (ref 26.0–34.0)
MCHC: 34.2 g/dL (ref 30.0–36.0)
MCV: 85.3 fL (ref 80.0–100.0)
Platelets: 167 10*3/uL (ref 150–400)
RBC: 4.56 MIL/uL (ref 4.22–5.81)
RDW: 13.2 % (ref 11.5–15.5)
WBC: 8.1 10*3/uL (ref 4.0–10.5)
nRBC: 0 % (ref 0.0–0.2)

## 2023-02-05 LAB — RENAL FUNCTION PANEL
Albumin: 3.6 g/dL (ref 3.5–5.0)
Anion gap: 9 (ref 5–15)
BUN: 15 mg/dL (ref 6–20)
CO2: 23 mmol/L (ref 22–32)
Calcium: 8.6 mg/dL — ABNORMAL LOW (ref 8.9–10.3)
Chloride: 105 mmol/L (ref 98–111)
Creatinine, Ser: 0.81 mg/dL (ref 0.61–1.24)
GFR, Estimated: >60 mL/min (ref >60)
Glucose, Bld: 101 mg/dL — ABNORMAL HIGH (ref 70–99)
Phosphorus: 2.5 mg/dL (ref 2.5–4.6)
Potassium: 3.9 mmol/L (ref 3.5–5.1)
Sodium: 137 mmol/L (ref 135–145)

## 2023-02-05 LAB — ANCA TITERS
Atypical P-ANCA titer: <1:20 {titer}
C-ANCA: <1:20 {titer}
P-ANCA: <1:20 {titer}

## 2023-02-05 LAB — PROTEIN / CREATININE RATIO, URINE
Creatinine, Urine: 134 mg/dL
Protein Creatinine Ratio: 0.06 mg/mg{Cre} (ref 0.00–0.15)
Total Protein, Urine: 8 mg/dL

## 2023-02-05 LAB — CK: Total CK: 372 U/L (ref 49–397)

## 2023-02-05 LAB — ANTISTREPTOLYSIN O TITER: ASO: 124 IU/mL (ref 0.0–200.0)

## 2023-02-05 LAB — GLUCOSE, CAPILLARY: Glucose-Capillary: 101 mg/dL — ABNORMAL HIGH (ref 70–99)

## 2023-02-05 LAB — C3 COMPLEMENT: C3 Complement: 151 mg/dL (ref 82–167)

## 2023-02-05 LAB — ANA W/REFLEX IF POSITIVE: Anti Nuclear Antibody (ANA): NEGATIVE

## 2023-02-05 LAB — C4 COMPLEMENT: Complement C4, Body Fluid: 41 mg/dL — ABNORMAL HIGH (ref 12–38)

## 2023-02-05 NOTE — TOC Transition Note (Signed)
Transition of Care [facility]) - CM/SW Discharge Note   Patient Details  Name: Dakota Koch MRN: [id number] Date of Birth: 16-Nov-2005  Transition of Care [facility]) CM/SW Contact:  Tom-Johnson, Hershal Coria, RN Phone Number: 02/05/2023, 11:09 AM   Clinical Narrative:     Patient is scheduled for discharge today.  Readmission Risk Assessment done. Hospital f/u and discharge instructions on AVS. No TOC needs or recommendations noted. Mother, Dakota Koch to transport at discharge.  No further TOC needs noted.      Final next level of care: Home/Self Care Barriers to Discharge: Barriers Resolved   Patient Goals and CMS Choice CMS Medicare.gov Compare Post Acute Care list provided to:: Patient Choice offered to / list presented to : NA  Discharge Placement                  Patient to be transferred to facility by: Mother Name of family member notified: Dakota Koch    Discharge Plan and Services Additional resources added to the After Visit Summary for                  DME Arranged: N/A DME Agency: NA       HH Arranged: NA HH Agency: NA        Social Determinants of Health (SDOH) Interventions SDOH Screenings   Food Insecurity: No Food Insecurity (02/04/2023)  Housing: Low Risk  (02/04/2023)  Transportation Needs: No Transportation Needs (02/04/2023)  Utilities: Not At Risk (02/04/2023)  Depression (PHQ2-9): Low Risk  (01/16/2023)  Tobacco Use: Low Risk  (02/04/2023)     Readmission Risk Interventions    02/04/2023    5:42 PM 12/25/2022    4:18 PM  Readmission Risk Prevention Plan  Post Dischage Appt Complete Complete  Medication Screening Complete Complete  Transportation Screening Complete Complete

## 2023-02-05 NOTE — Progress Notes (Signed)
DISCHARGE NOTE HOME Dakota Koch to be discharged Home per MD order. Discussed prescriptions and follow up appointments with the patient. Prescriptions given to patient; medication list explained in detail. Patient verbalized understanding.  Skin clean, dry and intact without evidence of skin break down, no evidence of skin tears noted. IV catheter discontinued intact. Site without signs and symptoms of complications. Dressing and pressure applied. Pt denies pain at the site currently. No complaints noted.  Patient free of lines, drains, and wounds.   An After Visit Summary (AVS) was printed and given to the patient. Patient declined wheelchair escort to lobby, pt. Requesting to ambulate, accompanied by girlfriend, mother Corliss Parish providing transportation  Margarita Grizzle, Charity fundraiser

## 2023-02-05 NOTE — Progress Notes (Signed)
Patient ID: Dakota Koch, male   DOB: 2005/09/03, 17 y.o.   MRN: [id number] S: Feels much better today. O:BP 110/73 (BP Location: Left Arm)   Pulse (!) 56   Temp 97.8 F (36.6 C) (Oral)   Resp 18   Ht 5\' 8"  (1.727 m)   Wt 97.5 kg   SpO2 99%   BMI 32.68 kg/m   Intake/Output Summary (Last 24 hours) at 02/05/2023 1052 Last data filed at 02/05/2023 0910 Gross per 24 hour  Intake 3452.87 ml  Output 600 ml  Net 2852.87 ml   Intake/Output: I/O last 3 completed shifts: In: 2405.5 [P.O.:880; I.V.:525.5; IV Piggyback:1000] Out: 1000 [Urine:1000]  Intake/Output this shift:  Total I/O In: 2447.4 [P.O.:240; I.V.:2207.4] Out: -  Weight change:  Gen: NAD CVS: RRR Resp:CTA Abd: +BS, soft, NT/ND Ext: no edema  Recent Labs  Lab 02/03/23 2213 02/04/23 0212 02/04/23 0300 02/05/23 0442  NA 132*  135  --  132* 137  K 4.0  3.9  --  4.0 3.9  CL 95*  97*  --  96* 105  CO2 17*  17*  --  14* 23  GLUCOSE 159*  168*  --  162* 101*  BUN 37*  38*  --  36* 15  CREATININE 3.04*  3.05* 2.19* 3.09* 0.81  ALBUMIN 6.0*  6.0*  --  6.0* 3.6  CALCIUM 10.3  10.6*  --  10.5* 8.6*  PHOS 5.2*  --   --  2.5  AST 32  --  33  --   ALT 35  --  35  --    Liver Function Tests: Recent Labs  Lab 02/03/23 2213 02/04/23 0300 02/05/23 0442  AST 32 33  --   ALT 35 35  --   ALKPHOS 93 91  --   BILITOT 0.7 0.8  --   PROT 9.7* 10.1*  --   ALBUMIN 6.0*  6.0* 6.0* 3.6   Recent Labs  Lab 02/03/23 2213  LIPASE 30   No results for input(s): "AMMONIA" in the last 168 hours. CBC: Recent Labs  Lab 02/03/23 2213 02/04/23 0212 02/05/23 0442  WBC 18.3* 13.7* 8.1  HGB 17.5* 15.9 13.3  HCT 51.8 46.5 38.9*  MCV 84.9 84.5 85.3  PLT 252 220 167   Cardiac Enzymes: Recent Labs  Lab 02/04/23 0212 02/05/23 0442  CKTOTAL 411* 372   CBG: Recent Labs  Lab 02/04/23 1220 02/04/23 1633 02/04/23 2312 02/05/23 0713  GLUCAP 116* 140* 134* 101*    Iron Studies: No results for input(s):  "IRON", "TIBC", "TRANSFERRIN", "FERRITIN" in the last 72 hours. Studies/Results: CT Renal Stone Study  Result Date: 02/04/2023 CLINICAL DATA:  Nausea and vomiting for 1 day with flank pain, initial encounter EXAM: CT ABDOMEN AND PELVIS WITHOUT CONTRAST TECHNIQUE: Multidetector CT imaging of the abdomen and pelvis was performed following the standard protocol without IV contrast. RADIATION DOSE REDUCTION: This exam was performed according to the departmental dose-optimization program which includes automated exposure control, adjustment of the mA and/or kV according to patient size and/or use of iterative reconstruction technique. COMPARISON:  12/24/2022 FINDINGS: Lower chest: No acute abnormality. Hepatobiliary: No focal liver abnormality is seen. No gallstones, gallbladder wall thickening, or biliary dilatation. Pancreas: Unremarkable. No pancreatic ductal dilatation or surrounding inflammatory changes. Spleen: Normal in size without focal abnormality. Adrenals/Urinary Tract: Adrenal glands are within normal limits. Kidneys are well visualized bilaterally. No renal calculi are seen. Bladder is partially distended. Stomach/Bowel: Appendix is within normal limits. No obstructive or inflammatory  changes of the colon are noted. Small bowel and stomach are within normal limits. Vascular/Lymphatic: No significant vascular findings are present. No enlarged abdominal or pelvic lymph nodes. Reproductive: Prostate is unremarkable. Other: No abdominal wall hernia or abnormality. No abdominopelvic ascites. Musculoskeletal: No acute or significant osseous findings. IMPRESSION: No acute abnormality noted. Electronically Signed   By: Alcide Clever M.D.   On: 02/04/2023 01:10    enoxaparin (LOVENOX) injection  40 mg Subcutaneous Q24H   insulin aspart  0-9 Units Subcutaneous TID WC   sodium bicarbonate  650 mg Oral BID    BMET    Component Value Date/Time   NA 137 02/05/2023 0442   K 3.9 02/05/2023 0442   CL 105  02/05/2023 0442   CO2 23 02/05/2023 0442   GLUCOSE 101 (H) 02/05/2023 0442   BUN 15 02/05/2023 0442   CREATININE 0.81 02/05/2023 0442   CALCIUM 8.6 (L) 02/05/2023 0442   GFRNONAA >60 02/05/2023 0442   CBC    Component Value Date/Time   WBC 8.1 02/05/2023 0442   RBC 4.56 02/05/2023 0442   HGB 13.3 02/05/2023 0442   HCT 38.9 (L) 02/05/2023 0442   PLT 167 02/05/2023 0442   MCV 85.3 02/05/2023 0442   MCH 29.2 02/05/2023 0442   MCHC 34.2 02/05/2023 0442   RDW 13.2 02/05/2023 0442    Assessment/Plan:  AKI/CKD stage I - recurrent in setting of volume depletion.  Scr returned to normal at last hospitalization.  Unclear why he is so sensitive to hypovolemia.  Will order serologies for completeness.  CT scan stone protocol was unremarkable.   BUN/Cr now back to normal after IVF's.  Nothing further to add and will sign off for now.  I will arrange for outpatient follow up in our clinic as he does have early stage CKD stage I presumably due to HTN and DM.  Acute GN workup pending but ANA was negative.   Please call with any questions or concerns.    Avoid nephrotoxic medications including NSAIDs and iodinated intravenous contrast exposure unless the latter is absolutely indicated.  Preferred narcotic agents for pain control are hydromorphone, fentanyl, and methadone. Morphine should not be used. Avoid Baclofen and avoid oral sodium phosphate and magnesium citrate based laxatives / bowel preps. Continue strict Input and Output monitoring. Will monitor the patient closely with you and intervene or adjust therapy as indicated by changes in clinical status/labs  Recurrent N/V - felt to be due to gastritis last month.  Consider GI consult since this has recurred within a month. Proteinuria - likely due to volume depletion.  Will repeat today and workup as above. Hyperglycemia/pre-diabetes - had Hgb A1c of 5.8% in May 2024.  Likely has diagnosis of type 2 DM given glucose of 168.  Per primary.  Irena Cords, MD [facility]

## 2023-02-05 NOTE — Discharge Summary (Signed)
Physician Discharge Summary  Marquett Ferrando ZOX:[id number] DOB: Dec 07, 2005 DOA: 02/03/2023  PCP: Pcp, No  Admit date: 02/03/2023 Discharge date: 02/05/2023  Admitted From: Home Disposition:  HOme  Recommendations for Outpatient Follow-up:  Follow up with PCP in 1-2 weeks Please obtain BMP/CBC in one week your next doctors visit.  Outpatient follow-up with nephrology. Vies to continue oral hydration at home   Discharge Condition: Stable CODE STATUS: Full code Diet recommendation: Regular  Brief/Interim Summary:  17 year old without any known past medical history comes to the hospital with sudden onset of nausea and vomiting.  Upon admission she was noted to have acute kidney injury with creatinine 3.04, CT renal study unremarkable, mild rhabdomyolysis.  She was admitted to the hospital for aggressive IV fluids. Over the course of 24 hours in the hospital after IV fluids his renal function returned back to normal of 0.8.  He has been advised to avoid dehydration at home.  If he is interested in weight loss then he needs to speak with his PCP regarding various ways to lose weight.   Assessment & Plan:  Principal Problem:   AKI (acute kidney injury) (HCC)      AKI, suspect prerenal in nature Mild metabolic acidosis Admission creatinine 3.05, baseline 0.8.  CT renal study is unremarkable.  UA unremarkable.  Renal function is now back to baseline with creatinine of 08.   Hypercalcemia likely secondary to dehydration after vomiting Resolved   Mild euvolemic hyponatremia Resolved   Prediabetes with hyperglycemia Last hemoglobin A1c is 5.8 on 12/25/2022 Closely monitor   Obesity BMI 35 Ongoing intentional weight loss, reported by the patient Recommend weight loss outpatient through regular physical activity and healthy dieting.   Rhabdomyolysis CK levels 411, improved with fluids   Discharge Diagnoses:  Principal Problem:   AKI (acute kidney injury)  [facility])      Consultations: Nephrology  Subjective: Feels good no complaints.  Wishes to go home Discharge Exam: Vitals:   02/05/23 0551 02/05/23 0741  BP: (!) 142/57 110/73  Pulse: (!) 48 (!) 56  Resp: 18 18  Temp: 97.9 F (36.6 C) 97.8 F (36.6 C)  SpO2: 98% 99%   Vitals:   02/04/23 1456 02/04/23 2017 02/05/23 0551 02/05/23 0741  BP: (!) 144/64 117/63 (!) 142/57 110/73  Pulse: 61 64 (!) 48 (!) 56  Resp: 17 18 18 18   Temp: 98.1 F (36.7 C) 97.7 F (36.5 C) 97.9 F (36.6 C) 97.8 F (36.6 C)  TempSrc: Oral Oral Oral Oral  SpO2: 97% 100% 98% 99%  Weight:      Height:        General: Pt is alert, awake, not in acute distress Cardiovascular: RRR, S1/S2 +, no rubs, no gallops Respiratory: CTA bilaterally, no wheezing, no rhonchi Abdominal: Soft, NT, ND, bowel sounds + Extremities: no edema, no cyanosis  Discharge Instructions   Allergies as of 02/05/2023   No Known Allergies      Medication List    You have not been prescribed any medications.     No Known Allergies  You were cared for by a hospitalist during your hospital stay. If you have any questions about your discharge medications or the care you received while you were in the hospital after you are discharged, you can call the unit and asked to speak with the hospitalist on call if the hospitalist that took care of you is not available. Once you are discharged, your primary care physician will handle any further medical issues. Please  note that no refills for any discharge medications will be authorized once you are discharged, as it is imperative that you return to your primary care physician (or establish a relationship with a primary care physician if you do not have one) for your aftercare needs so that they can reassess your need for medications and monitor your lab values.  You were cared for by a hospitalist during your hospital stay. If you have any questions about your discharge medications or the  care you received while you were in the hospital after you are discharged, you can call the unit and asked to speak with the hospitalist on call if the hospitalist that took care of you is not available. Once you are discharged, your primary care physician will handle any further medical issues. Please note that NO REFILLS for any discharge medications will be authorized once you are discharged, as it is imperative that you return to your primary care physician (or establish a relationship with a primary care physician if you do not have one) for your aftercare needs so that they can reassess your need for medications and monitor your lab values.  Please request your Prim.MD to go over all Hospital Tests and Procedure/Radiological results at the follow up, please get all Hospital records sent to your Prim MD by signing hospital release before you go home.  Get CBC, CMP, 2 view Chest X ray checked  by Primary MD during your next visit or SNF MD in 5-7 days ( we routinely change or add medications that can affect your baseline labs and fluid status, therefore we recommend that you get the mentioned basic workup next visit with your PCP, your PCP may decide not to get them or add new tests based on their clinical decision)  On your next visit with your primary care physician please Get Medicines reviewed and adjusted.  If you experience worsening of your admission symptoms, develop shortness of breath, life threatening emergency, suicidal or homicidal thoughts you must seek medical attention immediately by calling 911 or calling your MD immediately  if symptoms less severe.  You Must read complete instructions/literature along with all the possible adverse reactions/side effects for all the Medicines you take and that have been prescribed to you. Take any new Medicines after you have completely understood and accpet all the possible adverse reactions/side effects.   Do not drive, operate heavy machinery,  perform activities at heights, swimming or participation in water activities or provide baby sitting services if your were admitted for syncope or siezures until you have seen by Primary MD or a Neurologist and advised to do so again.  Do not drive when taking Pain medications.   Procedures/Studies: CT Renal Stone Study  Result Date: 02/04/2023 CLINICAL DATA:  Nausea and vomiting for 1 day with flank pain, initial encounter EXAM: CT ABDOMEN AND PELVIS WITHOUT CONTRAST TECHNIQUE: Multidetector CT imaging of the abdomen and pelvis was performed following the standard protocol without IV contrast. RADIATION DOSE REDUCTION: This exam was performed according to the departmental dose-optimization program which includes automated exposure control, adjustment of the mA and/or kV according to patient size and/or use of iterative reconstruction technique. COMPARISON:  12/24/2022 FINDINGS: Lower chest: No acute abnormality. Hepatobiliary: No focal liver abnormality is seen. No gallstones, gallbladder wall thickening, or biliary dilatation. Pancreas: Unremarkable. No pancreatic ductal dilatation or surrounding inflammatory changes. Spleen: Normal in size without focal abnormality. Adrenals/Urinary Tract: Adrenal glands are within normal limits. Kidneys are well visualized bilaterally. No renal  calculi are seen. Bladder is partially distended. Stomach/Bowel: Appendix is within normal limits. No obstructive or inflammatory changes of the colon are noted. Small bowel and stomach are within normal limits. Vascular/Lymphatic: No significant vascular findings are present. No enlarged abdominal or pelvic lymph nodes. Reproductive: Prostate is unremarkable. Other: No abdominal wall hernia or abnormality. No abdominopelvic ascites. Musculoskeletal: No acute or significant osseous findings. IMPRESSION: No acute abnormality noted. Electronically Signed   By: Alcide Clever M.D.   On: 02/04/2023 01:10     The results of significant  diagnostics from this hospitalization (including imaging, microbiology, ancillary and laboratory) are listed below for reference.     Microbiology: No results found for this or any previous visit (from the past 240 hour(s)).   Labs: BNP (last 3 results) No results for input(s): "BNP" in the last 8760 hours. Basic Metabolic Panel: Recent Labs  Lab 02/03/23 2213 02/04/23 0212 02/04/23 0300 02/05/23 0442  NA 132*  135  --  132* 137  K 4.0  3.9  --  4.0 3.9  CL 95*  97*  --  96* 105  CO2 17*  17*  --  14* 23  GLUCOSE 159*  168*  --  162* 101*  BUN 37*  38*  --  36* 15  CREATININE 3.04*  3.05* 2.19* 3.09* 0.81  CALCIUM 10.3  10.6*  --  10.5* 8.6*  MG  --   --   --  2.2  PHOS 5.2*  --   --  2.5   Liver Function Tests: Recent Labs  Lab 02/03/23 2213 02/04/23 0300 02/05/23 0442  AST 32 33  --   ALT 35 35  --   ALKPHOS 93 91  --   BILITOT 0.7 0.8  --   PROT 9.7* 10.1*  --   ALBUMIN 6.0*  6.0* 6.0* 3.6   Recent Labs  Lab 02/03/23 2213  LIPASE 30   No results for input(s): "AMMONIA" in the last 168 hours. CBC: Recent Labs  Lab 02/03/23 2213 02/04/23 0212 02/05/23 0442  WBC 18.3* 13.7* 8.1  HGB 17.5* 15.9 13.3  HCT 51.8 46.5 38.9*  MCV 84.9 84.5 85.3  PLT 252 220 167   Cardiac Enzymes: Recent Labs  Lab 02/04/23 0212 02/05/23 0442  CKTOTAL 411* 372   BNP: Invalid input(s): "POCBNP" CBG: Recent Labs  Lab 02/04/23 1220 02/04/23 1633 02/04/23 2312 02/05/23 0713  GLUCAP 116* 140* 134* 101*   D-Dimer No results for input(s): "DDIMER" in the last 72 hours. Hgb A1c No results for input(s): "HGBA1C" in the last 72 hours. Lipid Profile No results for input(s): "CHOL", "HDL", "LDLCALC", "TRIG", "CHOLHDL", "LDLDIRECT" in the last 72 hours. Thyroid function studies No results for input(s): "TSH", "T4TOTAL", "T3FREE", "THYROIDAB" in the last 72 hours.  Invalid input(s): "FREET3" Anemia work up No results for input(s): "VITAMINB12", "FOLATE",  "FERRITIN", "TIBC", "IRON", "RETICCTPCT" in the last 72 hours. Urinalysis    Component Value Date/Time   COLORURINE YELLOW 02/04/2023 1335   APPEARANCEUR HAZY (A) 02/04/2023 1335   LABSPEC 1.025 02/04/2023 1335   PHURINE 5.0 02/04/2023 1335   GLUCOSEU NEGATIVE 02/04/2023 1335   HGBUR NEGATIVE 02/04/2023 1335   BILIRUBINUR NEGATIVE 02/04/2023 1335   KETONESUR 20 (A) 02/04/2023 1335   PROTEINUR 30 (A) 02/04/2023 1335   UROBILINOGEN 0.2 01/24/2013 2242   NITRITE NEGATIVE 02/04/2023 1335   LEUKOCYTESUR NEGATIVE 02/04/2023 1335   Sepsis Labs Recent Labs  Lab 02/03/23 2213 02/04/23 0212 02/05/23 0442  WBC 18.3* 13.7* 8.1  Microbiology No results found for this or any previous visit (from the past 240 hour(s)).   Time coordinating discharge:  I have spent 35 minutes face to face with the patient and on the ward discussing the patients care, assessment, plan and disposition with other care givers. >50% of the time was devoted counseling the patient about the risks and benefits of treatment/Discharge disposition and coordinating care.   SIGNED:   Dimple Nanas, MD  [facility] 02/05/2023, 11:53 AM   If 7PM-7AM, please contact night-coverage

## 2023-02-05 NOTE — Plan of Care (Signed)

## 2023-02-06 LAB — COMPLEMENT, TOTAL: Compl, Total (CH50): >60 U/mL (ref >41)

## 2023-02-08 LAB — URINE DRUGS OF ABUSE SCREEN W ALC, ROUTINE (REF LAB)
Amphetamines, Urine: NEGATIVE ng/mL
Barbiturate, Ur: NEGATIVE ng/mL
Benzodiazepine Quant, Ur: NEGATIVE ng/mL
Cocaine (Metab.): NEGATIVE ng/mL
Ethanol U, Quan: NEGATIVE %
Methadone Screen, Urine: NEGATIVE ng/mL
Phencyclidine, Ur: NEGATIVE ng/mL
Propoxyphene, Urine: NEGATIVE ng/mL

## 2023-02-08 LAB — PANEL 799049
CARBOXY THC GC/MS CONF: 410 ng/mL
Cannabinoid GC/MS, Ur: POSITIVE — AB

## 2023-02-08 LAB — OPIATES CONFIRMATION, URINE
CODEINE: NEGATIVE
MORPHINE: POSITIVE — AB
Morphine GC/MS Conf: 1777 ng/mL
OPIATES: POSITIVE — AB

## 2023-03-03 ENCOUNTER — Ambulatory Visit: Payer: Self-pay | Admitting: *Deleted

## 2023-03-03 NOTE — Telephone Encounter (Signed)
Summary: Nausea with some vomitting Advice   Pt is calling report nausea for 2-3 days with some vomiting mostly after work - but comes and goes. Please advise     Interpreter Byrd Hesselbach called and patient reports he does not need interpreter. NT called patient back for remainder of triage.  Chief Complaint: N/V 2-3 days after working Symptoms: nausea and vomiting after getting out of work. Reports emesis as "bile" green in color at times. Not a lot. Comes and goes. Reports feeling better after eating. Reports lightheaded at times. Noted weight loss of couple of pounds. Reports nausea when waking up today. Reports skin might be a little yellow in color.  Frequency: 2-3 day  Pertinent Negatives: Patient denies fever no frequent vomiting  Disposition: [] ED /[] Urgent Care (no appt availability in office) / [] Appointment(In office/virtual)/ []  [place] [facility]/ [] Home Care/ [] Refused Recommended Disposition /[x] [place] [facility]/ [x]  Follow-up with PCP Additional Notes:  No available appt with Westley Hummer, NP until Aug 14. Recommended mobile bus today . Please advise .       Reason for Disposition  Yellowish color of the skin or white of the eye (i.e., jaundice)  Answer Assessment - Initial Assessment Questions 1. NAUSEA SEVERITY: "How bad is the nausea?" (e.g., mild, moderate, severe; dehydration, weight loss)   - MILD: loss of appetite without change in eating habits   - MODERATE: decreased oral intake without significant weight loss, dehydration, or malnutrition   - SEVERE: inadequate caloric or fluid intake, significant weight loss, symptoms of dehydration     Mild to moderate  2. ONSET: "When did the nausea begin?"     2-3 days ago  3. VOMITING: "Any vomiting?" If Yes, ask: "How many times today?"     Yes after working day shift. Emesis looks like "bile" green at times 4. RECURRENT SYMPTOM: "Have you had nausea before?" If Yes, ask: "When was the last time?" "What happened that  time?"     Yes mostly after working  5. CAUSE: "What do you think is causing the nausea?"     Not sure  6. PREGNANCY: "Is there any chance you are pregnant?" (e.g., unprotected intercourse, missed birth control pill, broken condom)     na  Protocols used: Nausea-A-AH

## 2023-03-03 NOTE — Telephone Encounter (Signed)
Spoke to pt. He did not want to schedule a sooner apt.

## 2023-03-03 NOTE — Telephone Encounter (Signed)
Please contact pt and schedule a sooner appt with any available provider at any office

## 2023-03-09 DIAGNOSIS — R1110 Vomiting, unspecified: Secondary | ICD-10-CM | POA: Diagnosis not present

## 2023-03-09 DIAGNOSIS — R112 Nausea with vomiting, unspecified: Secondary | ICD-10-CM | POA: Diagnosis not present

## 2023-06-16 DIAGNOSIS — R52 Pain, unspecified: Secondary | ICD-10-CM | POA: Diagnosis not present

## 2023-06-16 DIAGNOSIS — J0100 Acute maxillary sinusitis, unspecified: Secondary | ICD-10-CM | POA: Diagnosis not present

## 2023-06-16 DIAGNOSIS — R509 Fever, unspecified: Secondary | ICD-10-CM | POA: Diagnosis not present

## 2023-07-21 ENCOUNTER — Ambulatory Visit (INDEPENDENT_AMBULATORY_CARE_PROVIDER_SITE_OTHER): Payer: Self-pay | Admitting: Primary Care
